# Patient Record
Sex: Male | Born: 1985 | Hispanic: No | Marital: Married | Smoking: Current every day smoker
Health system: Southern US, Community
[De-identification: ages and names within clinical notes are randomized; demographics above are authoritative.]

## PROBLEM LIST (undated history)

## (undated) HISTORY — PX: KNEE SURGERY: SHX244

---
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
## 2017-09-25 ENCOUNTER — Ambulatory Visit: Payer: Self-pay | Admitting: Family Medicine

## 2017-10-09 ENCOUNTER — Ambulatory Visit (INDEPENDENT_AMBULATORY_CARE_PROVIDER_SITE_OTHER): Payer: 59 | Admitting: Family Medicine

## 2017-10-09 ENCOUNTER — Encounter: Payer: Self-pay | Admitting: Family Medicine

## 2017-10-09 VITALS — BP 124/71 | HR 71 | Ht 74.0 in | Wt 269.0 lb

## 2017-10-09 DIAGNOSIS — K649 Unspecified hemorrhoids: Secondary | ICD-10-CM

## 2017-10-09 DIAGNOSIS — B356 Tinea cruris: Secondary | ICD-10-CM

## 2017-10-09 DIAGNOSIS — L84 Corns and callosities: Secondary | ICD-10-CM | POA: Diagnosis not present

## 2017-10-09 MED ORDER — TERBINAFINE HCL 1 % EX CREA: 1.0000 "application " | TOPICAL_CREAM | Freq: Two times a day (BID) | CUTANEOUS | 0 refills | Status: DC

## 2017-10-09 NOTE — Progress Notes (Signed)
Subjective:    Patient ID: Glen Weaver, male    DOB: Apr 09, 1985, 32 y.o.   MRN: [id number]  HPI 32 yo male is here today to estab care.    He has a raise on his inner thighs. Has been using "jock itch" cream.  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  Says the cream helps with the itch.  He ran out and has not used it for quite some time and now it has spread.  He did have an episode where he had a very swollen hemorrhoid and actually went to the emergency department.  He was given suppositories and a cream which she has been using.  He did not start the stool softener.  He says a couple months ago he actually changed his diet and is eating a keto type diet and switched completely to water but has had more hard stools since then.  He is also had a spot on the bottom of his left foot to the lateral side.  It is getting painful to walk on.  He is not sure what has caused it no trauma or injury.   Review of Systems  Constitutional: Negative for diaphoresis, fever and unexpected weight change.  HENT: Negative for hearing loss, rhinorrhea, sneezing and tinnitus.   Eyes: Negative for visual disturbance.  Respiratory: Negative for cough and wheezing.   Cardiovascular: Negative for chest pain and palpitations.  Gastrointestinal: Negative for blood in stool, diarrhea, nausea and vomiting.  Genitourinary: Negative for discharge and dysuria.  Musculoskeletal: Negative for arthralgias and myalgias.  Skin: Positive for rash.  Neurological: Negative for headaches.  Hematological: Negative for adenopathy.  Psychiatric/Behavioral: Negative for dysphoric mood and sleep disturbance. The patient is not nervous/anxious.     BP 124/71   Pulse 71   Ht 6\' 2"  (1.88 m)   Wt 269 lb (122 kg)   SpO2 99%   BMI 34.54 kg/m     Not on File  History reviewed. No pertinent past medical history.  Past Surgical History:  Procedure Laterality Date  . KNEE SURGERY Left    orthoscopic surgery    Social  History   Socioeconomic History  . Marital status: Married    Spouse name: Not on file  . Number of children: Not on file  . Years of education: Not on file  . Highest education level: Not on file  Occupational History  . Occupation: operates heavy Financial controller  . Financial resource strain: Not on file  . Food insecurity:    Worry: Not on file    Inability: Not on file  . Transportation needs:    Medical: Not on file    Non-medical: Not on file  Tobacco Use  . Smoking status: Current Every Day Smoker    Packs/day: 1.00    Types: Cigarettes    Start date: 2009  . Smokeless tobacco: Never Used  Substance and Sexual Activity  . Alcohol use: Yes    Alcohol/week: 12.0 standard drinks    Types: 12 Cans of beer per week  . Drug use: Yes    Types: Marijuana    Comment: rarely  . Sexual activity: Yes    Partners: Female  Lifestyle  . Physical activity:    Days per week: Not on file    Minutes per session: Not on file  . Stress: Not on file  Relationships  . Social connections:    Talks on phone: Not on file  Gets together: Not on file    Attends religious service: Not on file    Active member of club or organization: Not on file    Attends meetings of clubs or organizations: Not on file    Relationship status: Not on file  . Intimate partner violence:    Fear of current or ex partner: Not on file    Emotionally abused: Not on file    Physically abused: Not on file    Forced sexual activity: Not on file  Other Topics Concern  . Not on file  Social History Narrative  . Not on file    Family History  Problem Relation Age of Onset  . Hypertension Father     Outpatient Encounter Medications as of 10/09/2017  Medication Sig  . terbinafine (LAMISIL) 1 % cream Apply 1 application topically 2 (two) times daily. X 4 weeks.   No facility-administered encounter medications on file as of 10/09/2017.          Objective:   Physical Exam  Constitutional:  He is oriented to person, place, and time. He appears well-developed and well-nourished.  HENT:  Head: Normocephalic and atraumatic.  Cardiovascular: Normal rate, regular rhythm and normal heart sounds.  Pulmonary/Chest: Effort normal and breath sounds normal.  Neurological: He is alert and oriented to person, place, and time.  Skin: Skin is warm and dry.  At the bottom of the left foot laterally he has a corn.  Has a significant amount of callus formation on top of it.  On his right inner groin crease he has slightly pink well-demarcated rash.  No satellite lesions or increased moisture.  Psychiatric: He has a normal mood and affect. His behavior is normal.        Assessment & Plan:  Tinea cruris -based on exam and most consistent with tinea cruris.  Will treat with terbinafine 1% cream twice a day for 4 weeks.  If not completely resolved and please give us a call back.  Corn/Callous -recommend a trial of the over-the-counter corn and callus remover is a common medicated pads.  Also recommended using a pumice stone to remove some the dead skin from the service but just not using a pumice stone elsewhere on the body.  Hemorrhoids-did discuss the importance of getting on a stool softener.  This can help keep the stools soft without causing diarrhea which he is worried about health.  Explained that the straining to void the stool is likely what is causing the hemorrhoids to flare.  If he would like a refill of the suppositories he can call us back at any time.

## 2017-10-09 NOTE — Patient Instructions (Addendum)
Corns and Calluses Corns are small areas of thickened skin that occur on the top, sides, or tip of a toe. They contain a cone-shaped core with a point that can press on a nerve below. This causes pain. Calluses are areas of thickened skin that can occur anywhere on the body including hands, fingers, palms, soles of the feet, and heels.Calluses are usually larger than corns. What are the causes? Corns and calluses are caused by rubbing (friction) or pressure, such as from shoes that are too tight or do not fit properly. What increases the risk? Corns are more likely to develop in people who have toe deformities, such as hammer toes. Since calluses can occur with friction to any area of the skin, calluses are more likely to develop in people who:  Work with their hands.  Wear shoes that fit poorly, shoes that are too tight, or shoes that are high-heeled.  Have toes deformities.  What are the signs or symptoms? Symptoms of a corn or callus include:  A hard growth on the skin.  Pain or tenderness under the skin.  Redness and swelling.  Increased discomfort while wearing tight-fitting shoes.  How is this diagnosed? Corns and calluses may be diagnosed with a medical history and physical exam. How is this treated? Corns and calluses may be treated with:  Removing the cause of the friction or pressure. This may include: ? Changing your shoes. ? Wearing shoe inserts (orthotics) or other protective layers in your shoes, such as a corn pad. ? Wearing gloves.  Medicines to help soften skin in the hardened, thickened areas.  Reducing the size of the corn or callus by removing the dead layers of skin.  Antibiotic medicines to treat infection.  Surgery, if a toe deformity is the cause.  Follow these instructions at home:  Take medicines only as directed by your health care provider.  If you were prescribed an antibiotic, finish all of it even if you start to feel better.  Wear  shoes that fit well. Avoid wearing high-heeled shoes and shoes that are too tight or too loose.  Wear any padding, protective layers, gloves, or orthotics as directed by your health care provider.  Soak your hands or feet and then use a file or pumice stone to soften your corn or callus. Do this as directed by your health care provider.  Check your corn or callus every day for signs of infection. Watch for: ? Redness, swelling, or pain. ? Fluid, blood, or pus. Contact a health care provider if:  Your symptoms do not improve with treatment.  You have increased redness, swelling, or pain at the site of your corn or callus.  You have fluid, blood, or pus coming from your corn or callus.  You have new symptoms. This information is not intended to replace advice given to you by your health care provider. Make sure you discuss any questions you have with your health care provider. Document Released: 11/10/2003 Document Revised: 08/24/2015 Document Reviewed: 01/30/2014 Elsevier Interactive Patient Education  2018 Elsevier Inc. Jock Itch Jock itch (tinea cruris) is a fungal infection of the skin in the groin area. It is sometimes called ringworm, even though it is not caused by worms. It is caused by a fungus, which is a type of germ that thrives in dark, damp places. Jock itch causes a rash and itching in the groin and upper thigh area. It usually goes away in 2-3 weeks with treatment. What are the causes? The  fungus that causes jock itch may be spread by:  Touching a fungus infection elsewhere on your body-such as athlete's foot-and then touching your groin area.  Sharing towels or clothing with an infected person.  What increases the risk? Jock itch is most common in men and adolescent boys. This condition is more likely to develop from:  Being in hot, humid climates.  Wearing tight-fitting clothing or wet bathing suits for long periods of time.  Participating in sports.  Being  overweight.  Having diabetes.  What are the signs or symptoms? Symptoms of jock itch may include:  A red, pink, or brown rash in the groin area. The rash may spread to the thighs, anus, and buttocks.  Dry and scaly skin on or around the rash.  Itchiness.  How is this diagnosed? Most often, a health care provider can make the diagnosis by looking at your rash. Sometimes, a scraping of the infected skin will be taken. This sample may be tested by looking at it under a microscope or by trying to grow the fungus from the sample (culture). How is this treated? Treatment for this condition may include:  Antifungal medicine to kill the fungus. This may be in various forms: ? Skin cream or ointment. ? Medicine taken by mouth.  Skin cream or ointment to reduce the itching.  Compresses or medicated powders to dry the infected skin.  Follow these instructions at home:  Take medicines only as directed by your health care provider. Apply skin creams or ointments exactly as directed.  Wear loose-fitting clothing. ? Men should wear cotton boxer shorts. ? Women should wear cotton underwear.  Change your underwear every day to keep your groin dry.  Avoid hot baths.  Dry your groin area well after bathing. ? Use a separate towel to dry your groin area. This will help to prevent a spreading of the infection to other areas of your body.  Do not scratch the affected area.  Do not share towels with other people. Contact a health care provider if:  Your rash does not improve or it gets worse after 2 weeks of treatment.  Your rash is spreading.  Your rash returns after treatment is finished.  You have a fever.  You have redness, swelling, or pain in the area around your rash.  You have fluid, blood, or pus coming from your rash.  Your have your rash for more than 4 weeks. This information is not intended to replace advice given to you by your health care provider. Make sure you  discuss any questions you have with your health care provider. Document Released: 01/24/2002 Document Revised: 07/12/2015 Document Reviewed: 11/15/2013 Elsevier Interactive Patient Education  [employer]2018 Elsevier Inc[employer]

## 2018-02-12 ENCOUNTER — Ambulatory Visit (INDEPENDENT_AMBULATORY_CARE_PROVIDER_SITE_OTHER): Payer: 59

## 2018-02-12 ENCOUNTER — Encounter: Payer: Self-pay | Admitting: Sports Medicine

## 2018-02-12 ENCOUNTER — Ambulatory Visit (INDEPENDENT_AMBULATORY_CARE_PROVIDER_SITE_OTHER): Payer: 59 | Admitting: Sports Medicine

## 2018-02-12 VITALS — BP 120/70 | HR 79 | Temp 98.0°F | Ht 74.0 in | Wt 259.0 lb

## 2018-02-12 DIAGNOSIS — J029 Acute pharyngitis, unspecified: Secondary | ICD-10-CM | POA: Diagnosis not present

## 2018-02-12 DIAGNOSIS — R918 Other nonspecific abnormal finding of lung field: Secondary | ICD-10-CM | POA: Diagnosis not present

## 2018-02-12 DIAGNOSIS — J181 Lobar pneumonia, unspecified organism: Secondary | ICD-10-CM

## 2018-02-12 DIAGNOSIS — R059 Cough, unspecified: Secondary | ICD-10-CM

## 2018-02-12 DIAGNOSIS — R05 Cough: Secondary | ICD-10-CM

## 2018-02-12 DIAGNOSIS — J189 Pneumonia, unspecified organism: Secondary | ICD-10-CM | POA: Insufficient documentation

## 2018-02-12 LAB — POCT INFLUENZA A/B
Influenza A, POC: NEGATIVE
Influenza B, POC: NEGATIVE

## 2018-02-12 MED ORDER — AZITHROMYCIN 250 MG PO TABS: ORAL_TABLET | ORAL | 0 refills | Status: DC

## 2018-02-12 NOTE — Addendum Note (Signed)
Addended by: Monica BectonHEKKEKANDAM, THOMAS J on: 02/12/2018 09:33 AM   Modules accepted: Orders

## 2018-02-12 NOTE — Assessment & Plan Note (Addendum)
Malaise, myalgias, cough with mild hemoptysis. Left upper lobe coarse crackles. Adding a chest x-ray, azithromycin for suspected pneumonia. Flu test negative. Return if no better in a week or 2. May use over-the-counter cold and flu medication for symptomatic relief.  Glen Weaver does have a pneumonia, though it is in the right upper lobe, he needs to complete the course of azithromycin and in a month we probably need a new chest x-ray to ensure clearance considering his hemoptysis.

## 2018-02-12 NOTE — Progress Notes (Addendum)
Subjective:    CC: Feeling sick  HPI: This is a pleasant and previously healthy 32 year old male, for the past few days he has had increasing malaise, myalgias, no fevers or chills.  Mild cough productive of bloody sputum.  No GI symptoms, skin rash.  Symptoms are moderate, persistent.  I reviewed the past medical history, family history, social history, surgical history, and allergies today and no changes were needed.  Please see the problem list section below in epic for further details.  Past Medical History: No past medical history on file. Past Surgical History: Past Surgical History:  Procedure Laterality Date  . KNEE SURGERY Left    orthoscopic surgery   Social History: Social History   Socioeconomic History  . Marital status: Married    Spouse name: Not on file  . Number of children: Not on file  . Years of education: Not on file  . Highest education level: Not on file  Occupational History  . Occupation: operates heavy Financial controllermachineray  Social Needs  . Financial resource strain: Not on file  . Food insecurity:    Worry: Not on file    Inability: Not on file  . Transportation needs:    Medical: Not on file    Non-medical: Not on file  Tobacco Use  . Smoking status: Current Every Day Smoker    Packs/day: 1.00    Types: Cigarettes    Start date: 2009  . Smokeless tobacco: Never Used  Substance and Sexual Activity  . Alcohol use: Yes    Alcohol/week: 12.0 standard drinks    Types: 12 Cans of beer per week  . Drug use: Yes    Types: Marijuana    Comment: rarely  . Sexual activity: Yes    Partners: Female  Lifestyle  . Physical activity:    Days per week: Not on file    Minutes per session: Not on file  . Stress: Not on file  Relationships  . Social connections:    Talks on phone: Not on file    Gets together: Not on file    Attends religious service: Not on file    Active member of club or organization: Not on file    Attends meetings of clubs or  organizations: Not on file    Relationship status: Not on file  Other Topics Concern  . Not on file  Social History Narrative  . Not on file   Family History: Family History  Problem Relation Age of Onset  . Hypertension Father    Allergies: No Known Allergies Medications: See med rec.  Review of Systems: No fevers, chills, night sweats, weight loss, chest pain, or shortness of breath.   Objective:    General: Well Developed, well nourished, and in no acute distress.  Neuro: Alert and oriented x3, extra-ocular muscles intact, sensation grossly intact.  HEENT: Normocephalic, atraumatic, pupils equal round reactive to light, neck supple, no masses, no lymphadenopathy, thyroid nonpalpable.  Oropharynx, nasopharynx, ear canals unremarkable. Skin: Warm and dry, no rashes. Cardiac: Regular rate and rhythm, no murmurs rubs or gallops, no lower extremity edema.  Respiratory: Coarse crackles in the left upper lobe. Not using accessory muscles, speaking in full sentences.  Impression and Recommendations:    Community acquired pneumonia Malaise, myalgias, cough with mild hemoptysis. Left upper lobe coarse crackles. Adding a chest x-ray, azithromycin for suspected pneumonia. Flu test negative. Return if no better in a week or 2. May use over-the-counter cold and flu medication for symptomatic relief.  Ivin BootyJoshua does have a pneumonia, though it is in the right upper lobe, he needs to complete the course of azithromycin and in a month we probably need a new chest x-ray to ensure clearance considering his hemoptysis. ___________________________________________ Ihor Austinhomas J. Benjamin Stainhekkekandam, M.D., ABFM., CAQSM. Primary Care and Sports Medicine [facility]  Adjunct Professor of Family Medicine  [employer]

## 2018-03-04 ENCOUNTER — Ambulatory Visit (INDEPENDENT_AMBULATORY_CARE_PROVIDER_SITE_OTHER): Payer: 59

## 2018-03-04 ENCOUNTER — Ambulatory Visit (INDEPENDENT_AMBULATORY_CARE_PROVIDER_SITE_OTHER): Payer: 59 | Admitting: Family Medicine

## 2018-03-04 ENCOUNTER — Encounter: Payer: Self-pay | Admitting: Family Medicine

## 2018-03-04 VITALS — BP 118/75 | HR 75 | Temp 98.4°F | Ht 74.0 in | Wt 262.0 lb

## 2018-03-04 DIAGNOSIS — R9389 Abnormal findings on diagnostic imaging of other specified body structures: Secondary | ICD-10-CM | POA: Diagnosis not present

## 2018-03-04 DIAGNOSIS — R05 Cough: Secondary | ICD-10-CM

## 2018-03-04 DIAGNOSIS — R059 Cough, unspecified: Secondary | ICD-10-CM

## 2018-03-04 DIAGNOSIS — Z720 Tobacco use: Secondary | ICD-10-CM | POA: Diagnosis not present

## 2018-03-04 DIAGNOSIS — R0602 Shortness of breath: Secondary | ICD-10-CM | POA: Diagnosis not present

## 2018-03-04 MED ORDER — VARENICLINE TARTRATE 0.5 MG X 11 & 1 MG X 42 PO MISC: ORAL | 0 refills | Status: AC

## 2018-03-04 NOTE — Progress Notes (Signed)
Acute Office Visit  Subjective:    Patient ID: Glen Weaver, male    DOB: 03/27/1985, 33 y.o.   MRN: [id number]  Chief Complaint  Patient presents with  . Cough  . Shortness of Breath  . Chest Pain    HPI Patient is in today for cough.  A 33 year old male who currently smokes between a half a pack to three quarters of a pack of cigarettes per day.  He has no prior history of pulmonary disease or asthma.  Wife who is here with him today says that he always has a little bit of his chronic smoker's cough is always worse in the morning and in the evenings.  But more recently he came into the office on December 27 and saw 1 of my partners for sore throat and increased cough.  He did not have any fevers or chills and would report that sometimes he would see just a little bit of blood-tinged sputum usually just a little spot or small streak and it was usually bright red.  He was given a prescription for azithromycin and says that he did feel better.  But today he feels like his cough has ramped back up and has had a little bit of nasal congestion.  He is also had some more sinus drainage this week.  He feels like he has been hydrating well.  He has not noticed any wheezing.  He denies any prior history of childhood asthma etc.  Today he also felt a little bit of a pressure sensation in his chest.  He says it just him is feels like congestion in the mid chest area.  He said it was a little bit more uncomfortable this morning he says he would not really describe it as painful but just feels like there is a little bit there again as the day has gone on.  He feels just a little short of breath but no actual wheezing or increased respiratory rate.  Study Result   CLINICAL DATA:  33 year old male with cough and congestion for 3 days.  EXAM: CHEST - 2 VIEW  COMPARISON:  None.  FINDINGS: Opacity/consolidation within the MEDIAL RIGHT UPPER lobe is noted, most likely representing  pneumonia.  The lungs are otherwise clear.  No pleural effusion or pneumothorax.  Cardiomediastinal silhouette is otherwise unremarkable.  No acute bony abnormalities are identified.  IMPRESSION: MEDIAL RIGHT UPPER lobe opacity/consolidation, likely representing pneumonia. Given radiographic appearance, short-term chest x-ray follow-up is recommended to ensure improvement/resolution following appropriate therapy.      No past medical history on file.  Past Surgical History:  Procedure Laterality Date  . KNEE SURGERY Left    orthoscopic surgery    Family History  Problem Relation Age of Onset  . Hypertension Father     Social History   Socioeconomic History  . Marital status: Married    Spouse name: Not on file  . Number of children: Not on file  . Years of education: Not on file  . Highest education level: Not on file  Occupational History  . Occupation: operates heavy Psychologist, counselling  . Financial resource strain: Not on file  . Food insecurity:    Worry: Not on file    Inability: Not on file  . Transportation needs:    Medical: Not on file    Non-medical: Not on file  Tobacco Use  . Smoking status: Current Every Day Smoker    Packs/day: 1.00    Types:  Cigarettes    Start date: 2009  . Smokeless tobacco: Never Used  Substance and Sexual Activity  . Alcohol use: Yes    Alcohol/week: 12.0 standard drinks    Types: 12 Cans of beer per week  . Drug use: Yes    Types: Marijuana    Comment: rarely  . Sexual activity: Yes    Partners: Female  Lifestyle  . Physical activity:    Days per week: Not on file    Minutes per session: Not on file  . Stress: Not on file  Relationships  . Social connections:    Talks on phone: Not on file    Gets together: Not on file    Attends religious service: Not on file    Active member of club or organization: Not on file    Attends meetings of clubs or organizations: Not on file    Relationship status:  Not on file  . Intimate partner violence:    Fear of current or ex partner: Not on file    Emotionally abused: Not on file    Physically abused: Not on file    Forced sexual activity: Not on file  Other Topics Concern  . Not on file  Social History Narrative  . Not on file    Outpatient Medications Prior to Visit  Medication Sig Dispense Refill  . azithromycin (ZITHROMAX Z-PAK) 250 MG tablet Take 2 tablets (500 mg) on  Day 1,  followed by 1 tablet (250 mg) once daily on Days 2 through 5. (Patient not taking: Reported on 03/04/2018) 6 tablet 0   No facility-administered medications prior to visit.     No Known Allergies  ROS     Objective:    Physical Exam  Constitutional: He is oriented to person, place, and time. He appears well-developed and well-nourished.  HENT:  Head: Normocephalic and atraumatic.  Right Ear: External ear normal.  Left Ear: External ear normal.  Nose: Nose normal.  Mouth/Throat: Oropharynx is clear and moist.  TMs and canals are clear.   Eyes: Pupils are equal, round, and reactive to light. Conjunctivae and EOM are normal.  Neck: Neck supple. No thyromegaly present.  Cardiovascular: Normal rate and normal heart sounds.  Pulmonary/Chest: Effort normal and breath sounds normal.  Lymphadenopathy:    He has no cervical adenopathy.  Neurological: He is alert and oriented to person, place, and time.  Skin: Skin is warm and dry.  Psychiatric: He has a normal mood and affect.    BP 118/75   Pulse 75   Temp 98.4 F (36.9 C) (Oral)   Ht _0  (1.88 m)   Wt 262 lb (118.8 kg)   SpO2 98%   BMI 33.64 kg/m  Wt Readings from Last 3 Encounters:  03/04/18 262 lb (118.8 kg)  02/12/18 259 lb (117.5 kg)  10/09/17 269 lb (122 kg)    There are no preventive care reminders to display for this patient.  There are no preventive care reminders to display for this patient.   No results found for: TSH No results found for: WBC, HGB, HCT, MCV, PLT No results  found for: NA, K, CHLORIDE, CO2, GLUCOSE, BUN, CREATININE, BILITOT, ALKPHOS, AST, ALT, PROT, ALBUMIN, CALCIUM, ANIONGAP, EGFR, GFR No results found for: CHOL No results found for: HDL No results found for: LDLCALC No results found for: TRIG No results found for: CHOLHDL No results found for: HGBA1C     Assessment & Plan:   Problem List Items Addressed  This Visit      Other   Tobacco abuse    Other Visit Diagnoses    Cough    -  Primary   Relevant Orders   DG Chest 2 View   Abnormal chest x-ray       Relevant Orders   DG Chest 2 View     With cough and abnormal chest xray with increase in symptoms today will go and and repeat chest x-ray early and so instead of waiting till the end of the month.  He has seen just a little tinge of blood which could be coming more from the sinuses or even the back of the throat but if the chest x-ray has not improved then consider further work-up including testing for TB and getting some labs including a CBC with differential and maybe even a chest CT.  Tobacco abuse-discussed cessation discuss strategies around this including Chantix and Wellbutrin as medication options.  He would like to try Chantix discussed risk benefits and potential side effects.  Meds ordered this encounter  Medications  . varenicline (CHANTIX PAK) 0.5 MG X 11 & 1 MG X 42 tablet    Sig: Take one 0.5 mg tablet by mouth once daily for 3 days, then increase to one 0.5 mg tablet twice daily for 4 days, then increase to one 1 mg tablet twice daily.    Dispense:  53 tablet    Refill:  0     Beatrice Lecher, MD

## 2018-03-05 ENCOUNTER — Ambulatory Visit: Payer: 59 | Admitting: Family Medicine

## 2018-03-08 ENCOUNTER — Ambulatory Visit (INDEPENDENT_AMBULATORY_CARE_PROVIDER_SITE_OTHER): Payer: 59

## 2018-03-08 DIAGNOSIS — R0602 Shortness of breath: Secondary | ICD-10-CM

## 2018-03-08 DIAGNOSIS — R9389 Abnormal findings on diagnostic imaging of other specified body structures: Secondary | ICD-10-CM

## 2018-03-08 DIAGNOSIS — R918 Other nonspecific abnormal finding of lung field: Secondary | ICD-10-CM

## 2018-03-08 DIAGNOSIS — R05 Cough: Secondary | ICD-10-CM

## 2018-03-08 DIAGNOSIS — R222 Localized swelling, mass and lump, trunk: Secondary | ICD-10-CM

## 2018-03-08 DIAGNOSIS — R059 Cough, unspecified: Secondary | ICD-10-CM

## 2018-03-11 ENCOUNTER — Encounter: Payer: Self-pay | Admitting: Pulmonary Disease

## 2018-03-11 ENCOUNTER — Ambulatory Visit (INDEPENDENT_AMBULATORY_CARE_PROVIDER_SITE_OTHER): Payer: 59 | Admitting: Pulmonary Disease

## 2018-03-11 VITALS — BP 132/62 | HR 97 | Ht 72.0 in | Wt 259.6 lb

## 2018-03-11 DIAGNOSIS — J181 Lobar pneumonia, unspecified organism: Secondary | ICD-10-CM | POA: Diagnosis not present

## 2018-03-11 DIAGNOSIS — J189 Pneumonia, unspecified organism: Secondary | ICD-10-CM

## 2018-03-11 DIAGNOSIS — R911 Solitary pulmonary nodule: Secondary | ICD-10-CM

## 2018-03-11 MED ORDER — LEVOFLOXACIN 750 MG PO TABS: 750.0000 mg | ORAL_TABLET | Freq: Every day | ORAL | 0 refills | Status: AC

## 2018-03-11 NOTE — Progress Notes (Signed)
Glen Weaver    [id number]    1985-06-18  Primary Care Physician:Metheney, Barbarann Ehlers, MD  Referring Physician: Agapito Games, MD [address] [place], [place] [address]  Chief complaint: Abnormal CT scan  HPI: 33 year old with no significant past medical history.   He was evaluated for community-acquired pneumonia with right upper lobe medial infiltrate at the end of December.  Treated with azithromycin.  Symptoms at that time consist of malaise, myalgia, cough with mild hemoptysis.  Seen again primary care on 1/16 with ongoing cough Repeat chest x-ray showed persistence of infiltrate and follow-up CT showed right upper lobe consolidation.  He has been referred here for further evaluation Overall he feels he is better than before.  Still has some daily cough in the morning with white mucus.  No fevers, chills.  States that hemoptysis has resolved  No risk factors for TB.  No history of incarceration, homelessness, IV drug use.  He is married with his wife and child.  No high risk sexual activity.  No known exposure to TB  Pets: No pets Occupation: Works as a Fish farm manager for the [employer] Exposures: No known exposures Smoking history: He is an active smoker 15-pack-year history, no e-cigarette use,  Travel history: Significant travel history Relevant family history: No significant family history of lung disease  Outpatient Encounter Medications as of 03/11/2018  Medication Sig  . varenicline (CHANTIX PAK) 0.5 MG X 11 & 1 MG X 42 tablet Take one 0.5 mg tablet by mouth once daily for 3 days, then increase to one 0.5 mg tablet twice daily for 4 days, then increase to one 1 mg tablet twice daily.   No facility-administered encounter medications on file as of 03/11/2018.     Allergies as of 03/11/2018  . (No Known Allergies)    No past medical history on file.  Past Surgical History:  Procedure Laterality Date  . KNEE SURGERY Left    orthoscopic surgery    Family History  Problem Relation Age of Onset  . Hypertension Father   . Cancer Mother     Social History   Socioeconomic History  . Marital status: Married    Spouse name: Not on file  . Number of children: Not on file  . Years of education: Not on file  . Highest education level: Not on file  Occupational History  . Occupation: operates heavy Financial controller  . Financial resource strain: Not on file  . Food insecurity:    Worry: Not on file    Inability: Not on file  . Transportation needs:    Medical: Not on file    Non-medical: Not on file  Tobacco Use  . Smoking status: Current Every Day Smoker    Packs/day: 0.25    Types: Cigarettes    Start date: 2009  . Smokeless tobacco: Never Used  Substance and Sexual Activity  . Alcohol use: Yes    Alcohol/week: 12.0 standard drinks    Types: 12 Cans of beer per week    Comment: occ  . Drug use: Yes    Types: Marijuana    Comment: rarely  . Sexual activity: Yes    Partners: Female  Lifestyle  . Physical activity:    Days per week: Not on file    Minutes per session: Not on file  . Stress: Not on file  Relationships  . Social connections:    Talks  on phone: Not on file    Gets together: Not on file    Attends religious service: Not on file    Active member of club or organization: Not on file    Attends meetings of clubs or organizations: Not on file    Relationship status: Not on file  . Intimate partner violence:    Fear of current or ex partner: Not on file    Emotionally abused: Not on file    Physically abused: Not on file    Forced sexual activity: Not on file  Other Topics Concern  . Not on file  Social History Narrative  . Not on file    Review of systems: Review of Systems  Constitutional: Negative for fever and chills.  HENT: Negative.   Eyes: Negative for blurred vision.  Respiratory: as per HPI  Cardiovascular: Negative for chest pain and palpitations.    Gastrointestinal: Negative for vomiting, diarrhea, blood per rectum. Genitourinary: Negative for dysuria, urgency, frequency and hematuria.  Musculoskeletal: Negative for myalgias, back pain and joint pain.  Skin: Negative for itching and rash.  Neurological: Negative for dizziness, tremors, focal weakness, seizures and loss of consciousness.  Endo/Heme/Allergies: Negative for environmental allergies.  Psychiatric/Behavioral: Negative for depression, suicidal ideas and hallucinations.  All other systems reviewed and are negative.  Physical Exam: Blood pressure 132/62, pulse 97, height 6' (1.829 m), weight 259 lb 9.6 oz (117.8 kg), SpO2 97 %. Gen:      No acute distress HEENT:  EOMI, sclera anicteric Neck:     No masses; no thyromegaly Lungs:    Clear to auscultation bilaterally; normal respiratory effort CV:         Regular rate and rhythm; no murmurs Abd:      + bowel sounds; soft, non-tender; no palpable masses, no distension Ext:    No edema; adequate peripheral perfusion Skin:      Warm and dry; no rash Neuro: alert and oriented x 3 Psych: normal mood and affect  Data Reviewed: Imaging: Chest x-ray 02/12/2018- medial right upper lobe opacity Chest x-ray 03/04/2018- persistent consolidation in the right upper lobe with slight partial clearing. CT chest 03/08/2018- 5 cm area of consolidation in the right upper lobe possible mediastinal invasion.  I have reviewed the images personally.  Assessment:  Abnormal CT with right upper lobe consolidation Suspect this is secondary to community-acquired pneumonia.  Symptomatically he is better although he has slight persistent cough.  Follow-up chest x-ray does show some partial clearing.  CT scan reports possible mediastinal invasion but this is not entirely clear on my review of imaging. We discussed going ahead with bronchoscopy.  The area of consolidation is at a place that is difficult to get out and he would need navigational  bronchoscopy under general anesthesia to access it. Other option would be to give him another course of antibiotic and repeat CT scan  We have decided to go with conservative management.  I will give him a course of Levaquin and follow-up CT chest with contrast in 1 month If the consolidation is persistent then we will plan for bronchoscopy.  Plan/Recommendations: - Levofloxacin 750 mg a day for 7 days - CT chest with contrast in 1 month.  Chilton Greathouse MD [facility] 03/11/2018, 3:40 PM  CC: Agapito Games, *

## 2018-03-11 NOTE — Patient Instructions (Signed)
Will prescribe levofloxacin for 7 days CT chest with contrast in 1 month Follow-up in clinic after CT chest.

## 2018-03-11 NOTE — Addendum Note (Signed)
Addended by: Maxwell MarionBLANKENSHIP, MARGIE A on: 03/11/2018 04:17 PM   Modules accepted: Orders

## 2018-04-01 ENCOUNTER — Ambulatory Visit (INDEPENDENT_AMBULATORY_CARE_PROVIDER_SITE_OTHER): Payer: 59 | Admitting: Family Medicine

## 2018-04-01 ENCOUNTER — Encounter: Payer: Self-pay | Admitting: Family Medicine

## 2018-04-01 VITALS — BP 132/81 | HR 95 | Temp 98.9°F | Ht 74.0 in | Wt 271.0 lb

## 2018-04-01 DIAGNOSIS — R6889 Other general symptoms and signs: Secondary | ICD-10-CM | POA: Diagnosis not present

## 2018-04-01 LAB — POCT INFLUENZA A/B
INFLUENZA B, POC: NEGATIVE
Influenza A, POC: NEGATIVE

## 2018-04-01 MED ORDER — OSELTAMIVIR PHOSPHATE 75 MG PO CAPS: 75.0000 mg | ORAL_CAPSULE | Freq: Two times a day (BID) | ORAL | 0 refills | Status: AC

## 2018-04-01 NOTE — Patient Instructions (Addendum)
Thank you for coming in today. Take tylenol and ibuprofen or aleve.   Max dose of tylenol is 1000mg  every 6 hours. Be careful with combo medicines that also have tylenol acetaminophen Ok to take with ibuprofen or aleve.  Max dose of ibuprofen is 800mg  every 8 hours.  Max dose of aleve is 2 pills every 12 hours.    Ok to take generic muccinex DM extended release twice daily for cough.   Ok to take generic immodium as needed for diarrhea.    Call or go to the emergency room if you get worse, have trouble breathing, have chest pains, or palpitations.     Viral Respiratory Infection A respiratory infection is an illness that affects part of the respiratory system, such as the lungs, nose, or throat. A respiratory infection that is caused by a virus is called a viral respiratory infection. Common types of viral respiratory infections include:  A cold.  The flu (influenza).  A respiratory syncytial virus (RSV) infection. What are the causes? This condition is caused by a virus. What are the signs or symptoms? Symptoms of this condition include:  A stuffy or runny nose.  Yellow or green nasal discharge.  A cough.  Sneezing.  Fatigue.  Achy muscles.  A sore throat.  Sweating or chills.  A fever.  A headache. How is this diagnosed? This condition may be diagnosed based on:  Your symptoms.  A physical exam.  Testing of nasal swabs. How is this treated? This condition may be treated with medicines, such as:  Antiviral medicine. This may shorten the length of time a person has symptoms.  Expectorants. These make it easier to cough up mucus.  Decongestant nasal sprays.  Acetaminophen or NSAIDs to relieve fever and pain. Antibiotic medicines are not prescribed for viral infections. This is because antibiotics are designed to kill bacteria. They are not effective against viruses. Follow these instructions at home:  Managing pain and congestion  Take  over-the-counter and prescription medicines only as told by your health care provider.  If you have a sore throat, gargle with a salt-water mixture 3-4 times a day or as needed. To make a salt-water mixture, completely dissolve -1 tsp of salt in 1 cup of warm water.  Use nose drops made from salt water to ease congestion and soften raw skin around your nose.  Drink enough fluid to keep your urine pale yellow. This helps prevent dehydration and helps loosen up mucus. General instructions  Rest as much as possible.  Do not drink alcohol.  Do not use any products that contain nicotine or tobacco, such as cigarettes and e-cigarettes. If you need help quitting, ask your health care provider.  Keep all follow-up visits as told by your health care provider. This is important. How is this prevented?   Get an annual flu shot. You may get the flu shot in late summer, fall, or winter. Ask your health care provider when you should get your flu shot.  Avoid exposing others to your respiratory infection. ? Stay home from work or school as told by your health care provider. ? Wash your hands with soap and water often, especially after you cough or sneeze. If soap and water are not available, use alcohol-based hand sanitizer.  Avoid contact with people who are sick during cold and flu season. This is generally fall and winter. Contact a health care provider if:  Your symptoms last for 10 days or longer.  Your symptoms get worse  over time.  You have a fever.  You have severe sinus pain in your face or forehead.  The glands in your jaw or neck become very swollen. Get help right away if you:  Feel pain or pressure in your chest.  Have shortness of breath.  Faint or feel like you will faint.  Have severe and persistent vomiting.  Feel confused or disoriented. Summary  A respiratory infection is an illness that affects part of the respiratory system, such as the lungs, nose, or throat.  A respiratory infection that is caused by a virus is called a viral respiratory infection.  Common types of viral respiratory infections are a cold, influenza, and respiratory syncytial virus (RSV) infection.  Symptoms of this condition include a stuffy or runny nose, cough, sneezing, fatigue, achy muscles, sore throat, and fevers or chills.  Antibiotic medicines are not prescribed for viral infections. This is because antibiotics are designed to kill bacteria. They are not effective against viruses. This information is not intended to replace advice given to you by your health care provider. Make sure you discuss any questions you have with your health care provider. Document Released: 11/13/2004 Document Revised: 03/16/2017 Document Reviewed: 03/16/2017 Elsevier Interactive Patient Education  2019 [employer].

## 2018-04-01 NOTE — Progress Notes (Signed)
Glen Weaver is a 33 y.o. male who presents to [facility]: Primary Care Sports Medicine today for body aches. Buster was diagnosed with pneumonia on 01/23. He finished up his antibiotic one week ago. He had been feeling completely better and his cough had resolved up until today.  Anikin woke up this morning with back pain and body aches, as well as subjective fevers and chills. He has had a mild cough, some nausea, and mild stomach upset. This feels exactly like when he has had flu in the past. His wife is pregnant and he has a 77 year old son, so he wants to be extra cautious. Took an alka-seltzer this morning that did not alleviate symptoms. Denies sore throat, congestion, runny nose, and vomiting. He was around a coworker who had a stomach bug last week, but no sick contacts with the flu.   ROS as above:  Exam:  BP 132/81   Pulse 95   Temp 98.9 F (37.2 C) (Oral)   Ht 6\' 2"  (1.88 m)   Wt 271 lb (122.9 kg)   SpO2 99%   BMI 34.79 kg/m  Wt Readings from Last 5 Encounters:  04/01/18 271 lb (122.9 kg)  03/11/18 259 lb 9.6 oz (117.8 kg)  03/04/18 262 lb (118.8 kg)  02/12/18 259 lb (117.5 kg)  10/09/17 269 lb (122 kg)    Gen: Well NAD nontoxic appearing HEENT: EOMI,  MMM, mildly erythematous oropharynx. Could not visualize TM due to cerumen.  Lungs: Normal work of breathing. CTABL Heart: RRR no MRG Abd: NABS, Soft. Nondistended, Nontender Exts: Brisk capillary refill, warm and well perfused.   Lab and Radiology Results Results for orders placed or performed in visit on 04/01/18 (from the past 72 hour(s))  POCT Influenza A/B     Status: None   Collection Time: 04/01/18  2:57 PM  Result Value Ref Range   Influenza A, POC Negative Negative   Influenza B, POC Negative Negative   No results found.    Assessment and Plan: 33 y.o. male with  Flu-like symptoms: Callis woke up with flu  like symptoms this morning, including body ache and subjective fevers and chills. This is how he felt the last time he had the flu. Flu test negative; however, he most likely has the flu, due to symptoms and presentation similar to last time he had the flu. Prescribed Tamiflu. Take Tylenol or ibuprofen as needed. Discussed max dose of Tylenol. Recommended Mucinex DM XR for cough. His family does not need prophylactic treatment with Tamiflu since his test was negative. Recommended Immodium if his stomach upset worsens.     Orders Placed This Encounter  Procedures  . POCT Influenza A/B   Meds ordered this encounter  Medications  . oseltamivir (TAMIFLU) 75 MG capsule    Sig: Take 1 capsule (75 mg total) by mouth 2 (two) times daily.    Dispense:  10 capsule    Refill:  0     Historical information moved to improve visibility of documentation.  No past medical history on file. Past Surgical History:  Procedure Laterality Date  . KNEE SURGERY Left    orthoscopic surgery   Social History   Tobacco Use  . Smoking status: Current Every Day Smoker    Packs/day: 0.25    Types: Cigarettes    Start date: 2009  . Smokeless tobacco: Never Used  Substance Use Topics  . Alcohol use: Yes  Alcohol/week: 12.0 standard drinks    Types: 12 Cans of beer per week    Comment: occ   family history includes Cancer in his mother; Hypertension in his father.  Medications: Current Outpatient Medications  Medication Sig Dispense Refill  . levofloxacin (LEVAQUIN) 750 MG tablet Take 1 tablet (750 mg total) by mouth daily. 7 tablet 0  . varenicline (CHANTIX PAK) 0.5 MG X 11 & 1 MG X 42 tablet Take one 0.5 mg tablet by mouth once daily for 3 days, then increase to one 0.5 mg tablet twice daily for 4 days, then increase to one 1 mg tablet twice daily. 53 tablet 0  . oseltamivir (TAMIFLU) 75 MG capsule Take 1 capsule (75 mg total) by mouth 2 (two) times daily. 10 capsule 0   No current  facility-administered medications for this visit.    No Known Allergies   Discussed warning signs or symptoms. Please see discharge instructions. Patient expresses understanding.  I personally was present and performed or re-performed the history, physical exam and medical decision-making activities of this service and have verified that the service and findings are accurately documented in the student's note. ___________________________________________ Clementeen Graham M.D., ABFM., CAQSM. Primary Care and Sports Medicine Adjunct Instructor of Family Medicine  [employer]

## 2018-04-08 ENCOUNTER — Other Ambulatory Visit: Payer: Self-pay | Admitting: Pulmonary Disease

## 2018-04-08 DIAGNOSIS — R911 Solitary pulmonary nodule: Secondary | ICD-10-CM

## 2018-04-13 ENCOUNTER — Other Ambulatory Visit: Payer: 59

## 2018-04-15 ENCOUNTER — Ambulatory Visit (INDEPENDENT_AMBULATORY_CARE_PROVIDER_SITE_OTHER): Payer: 59

## 2018-04-15 ENCOUNTER — Ambulatory Visit: Payer: 59 | Admitting: Pulmonary Disease

## 2018-04-15 DIAGNOSIS — R911 Solitary pulmonary nodule: Secondary | ICD-10-CM

## 2018-04-15 DIAGNOSIS — R918 Other nonspecific abnormal finding of lung field: Secondary | ICD-10-CM

## 2018-04-15 MED ORDER — IOPAMIDOL (ISOVUE-300) INJECTION 61%
100.0000 mL | Freq: Once | INTRAVENOUS | Status: AC | PRN
  Administered 2018-04-15: 80 mL via INTRAVENOUS

## 2018-04-16 ENCOUNTER — Ambulatory Visit: Payer: 59 | Admitting: Pulmonary Disease

## 2018-04-16 ENCOUNTER — Other Ambulatory Visit: Payer: 59

## 2018-04-19 ENCOUNTER — Telehealth: Payer: Self-pay | Admitting: Pulmonary Disease

## 2018-04-19 DIAGNOSIS — R911 Solitary pulmonary nodule: Secondary | ICD-10-CM

## 2018-04-19 NOTE — Telephone Encounter (Signed)
Pt is aware of below message and voiced his understanding.  Pt aware that our office will contact him to reschedule after receiving response from Dr. Isaiah Serge.

## 2018-04-19 NOTE — Telephone Encounter (Signed)
Received email from Dr. Romie Levee requesting that bronch from 04/21/18 be canceled, as he is sick. Spoke to [place] with [facility] and canceled bronch.  ATC pt- unable to leave voicemail, as mailbox is full. Will call back. Dr. Isaiah Serge please advise when you would like to reschedule bronch. Thanks

## 2018-04-19 NOTE — Telephone Encounter (Signed)
Pt called back. I advised him that the phone call was due to the rescheduling of his Bronch and that someone will give him a call back with more details.

## 2018-04-21 ENCOUNTER — Ambulatory Visit (HOSPITAL_COMMUNITY): Admission: RE | Admit: 2018-04-21 | Payer: 59 | Source: Home / Self Care | Admitting: Pulmonary Disease

## 2018-04-21 ENCOUNTER — Encounter (HOSPITAL_COMMUNITY): Admission: RE | Payer: Self-pay | Source: Home / Self Care

## 2018-04-21 ENCOUNTER — Ambulatory Visit (HOSPITAL_COMMUNITY): Payer: 59

## 2018-04-21 SURGERY — BRONCHOSCOPY, WITH FLUOROSCOPY
Anesthesia: Moderate Sedation | Laterality: Bilateral

## 2018-04-27 NOTE — Telephone Encounter (Signed)
Called and spoke with patient, he stated that he did not know that his procedure was scheduled for this date. Patient stated that he cannot do this day and it would be better if he could do a Friday or a Monday.  Dr. Isaiah Serge please advise thank you.

## 2018-04-27 NOTE — Telephone Encounter (Signed)
Rescheduled for 3/18 at [facility] 7:30 AM.

## 2018-04-29 NOTE — Telephone Encounter (Signed)
Please check if 3/20 at [facility] 7:30 am is ok with him

## 2018-04-29 NOTE — Telephone Encounter (Signed)
Attempted to call patient today regarding rescheduling bronch for 05/07/18 at 730am. I did not receive an answer at time of call, VM is full couldn't LVM. X1

## 2018-04-30 NOTE — Telephone Encounter (Signed)
Attempted to call patient today regarding rescheduling bronch for 05/07/18 at 730am. I did not receive an answer at time of call, VM is full couldn't LVM. X2

## 2018-05-04 NOTE — Telephone Encounter (Signed)
I was able to call the patient to discuss bronchoscopy.  We have rescheduled him for 3/20 However patient would like to hold off on the procedure for now as he is feeling well and cannot take time off work.  He would prefer to get a follow-up CT scan  Order CT chest without contrast in 3 months time Follow-up in clinic after CT chest.

## 2018-05-04 NOTE — Telephone Encounter (Signed)
Spoke with patient. He is aware of the CT order and need for OV. Order has been placed. Will place a recall for patient to follow up in 3 months.   Nothing further needed at time of call.

## 2018-05-05 ENCOUNTER — Encounter (HOSPITAL_COMMUNITY): Admission: RE | Payer: Self-pay | Source: Home / Self Care

## 2018-05-05 ENCOUNTER — Ambulatory Visit (HOSPITAL_COMMUNITY): Admission: RE | Admit: 2018-05-05 | Payer: 59 | Source: Home / Self Care | Admitting: Pulmonary Disease

## 2018-05-05 ENCOUNTER — Encounter (HOSPITAL_COMMUNITY): Payer: 59

## 2018-05-05 SURGERY — BRONCHOSCOPY, WITH FLUOROSCOPY
Anesthesia: Moderate Sedation | Laterality: Bilateral

## 2018-08-04 ENCOUNTER — Ambulatory Visit (INDEPENDENT_AMBULATORY_CARE_PROVIDER_SITE_OTHER): Payer: 59

## 2018-08-04 ENCOUNTER — Other Ambulatory Visit: Payer: Self-pay

## 2018-08-04 DIAGNOSIS — R911 Solitary pulmonary nodule: Secondary | ICD-10-CM

## 2018-08-10 ENCOUNTER — Other Ambulatory Visit: Payer: Self-pay | Admitting: Pulmonary Disease

## 2018-08-10 DIAGNOSIS — R911 Solitary pulmonary nodule: Secondary | ICD-10-CM

## 2018-08-10 NOTE — Progress Notes (Signed)
F/u right lung mass 6-8 mos.

## 2019-03-17 ENCOUNTER — Other Ambulatory Visit: Payer: 59

## 2019-12-05 IMAGING — CT CT CHEST W/ CM
2 of 3 series · 15 of 36 positions shown, 18 images · IV contrast (iopamidol)
Comparison: 03/08/2018 chest CT.  03/04/2018 chest radiograph.

CLINICAL DATA: Follow-up nodular consolidation.

EXAM:
CT CHEST WITH CONTRAST
TECHNIQUE: Multidetector CT imaging of the chest was performed during
intravenous contrast administration.
CONTRAST:  80mL 8NDUUR-9NN IOPAMIDOL (8NDUUR-9NN) INJECTION 61%

[Series 2: axial st · axial · 0.91mm/px · z∈[-394,-89]mm · 12 of 73 slices shown, 15 images]
[im 6/73  mediastinal]
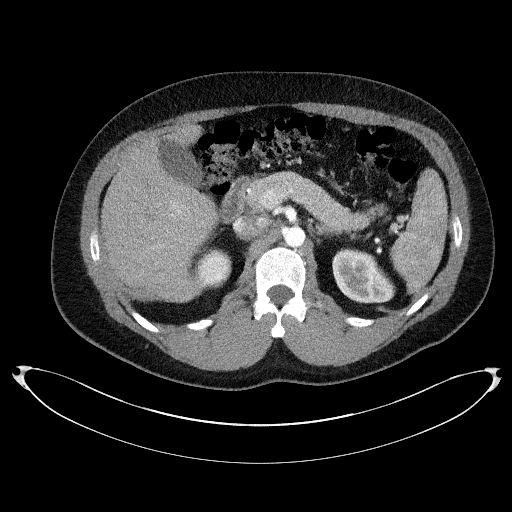
[im 6/73  lung]
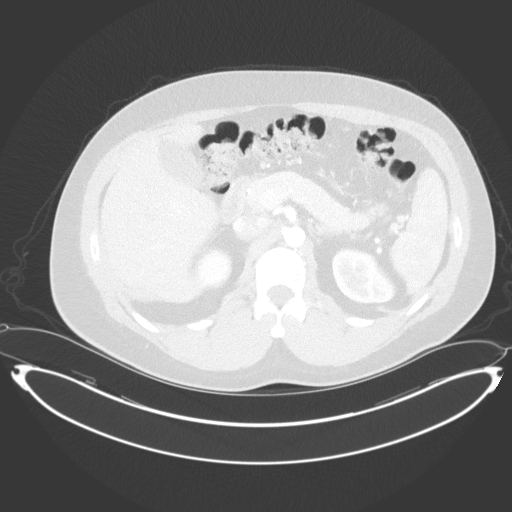
[im 11/73  lung]
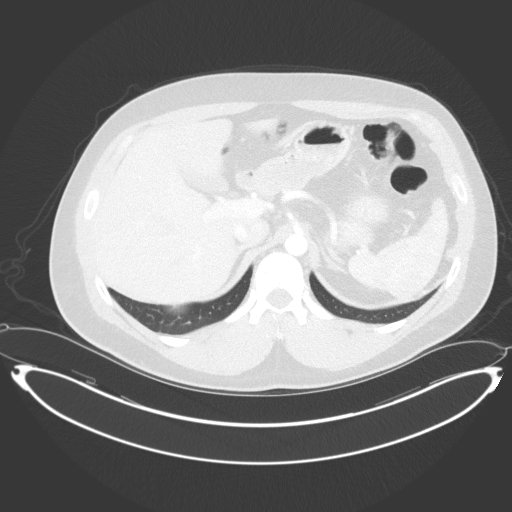
[im 17/73  lung]
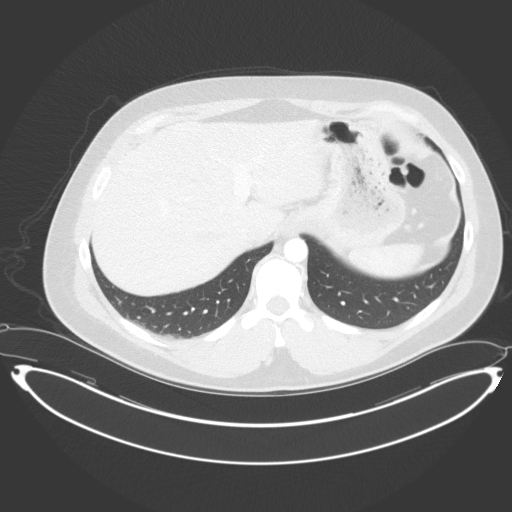
[im 22/73  lung]
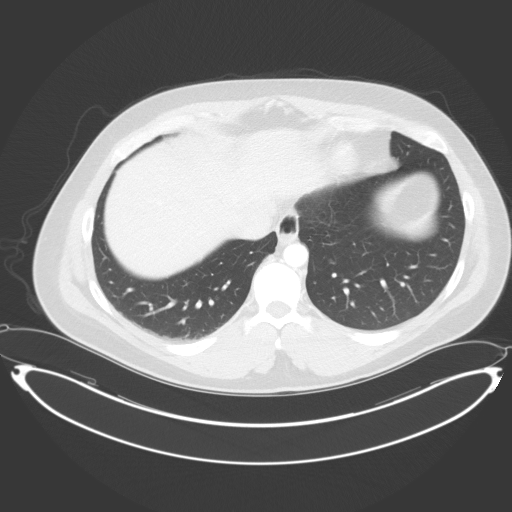
[im 27/73  mediastinal]
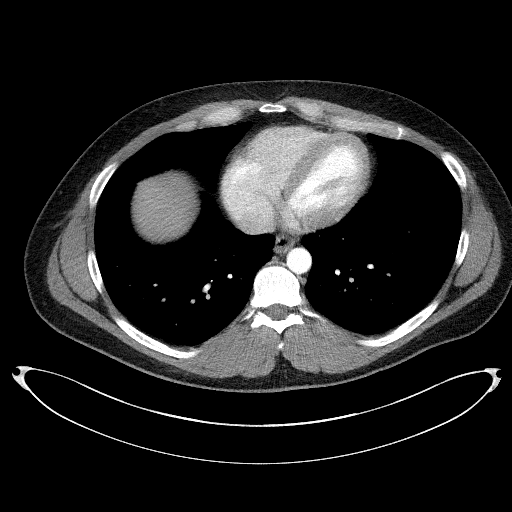
[im 27/73  lung]
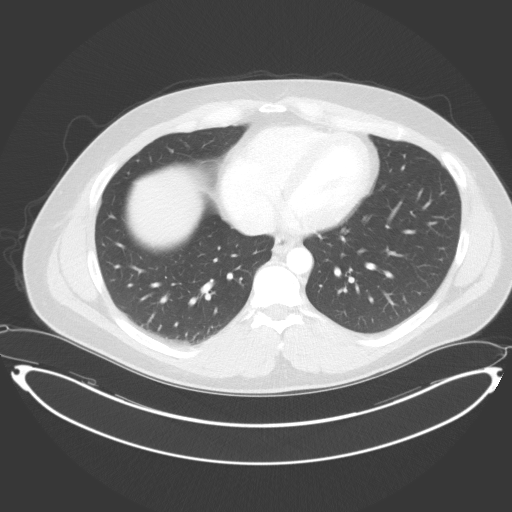
[im 33/73  lung]
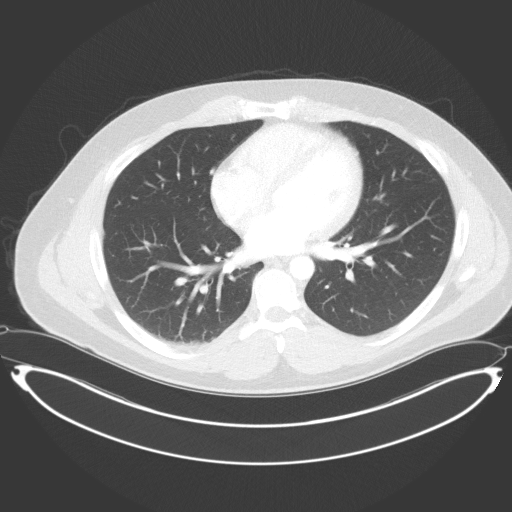
[im 41/73  lung]
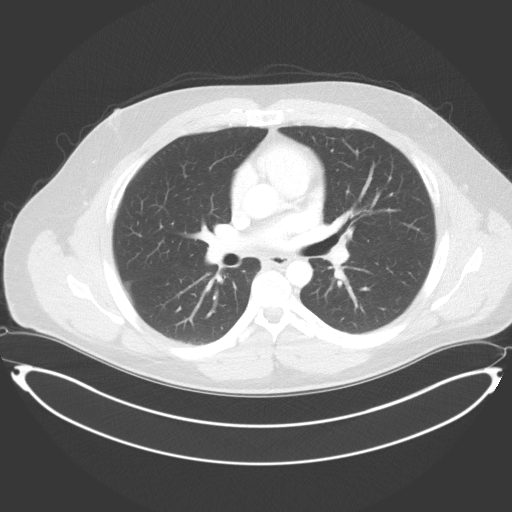
[im 46/73  lung]
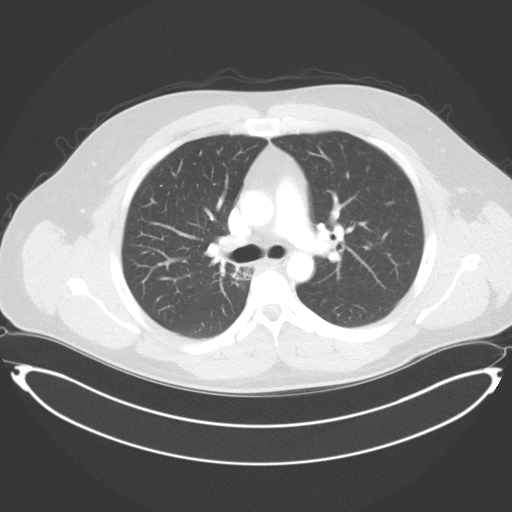
[im 51/73  mediastinal]
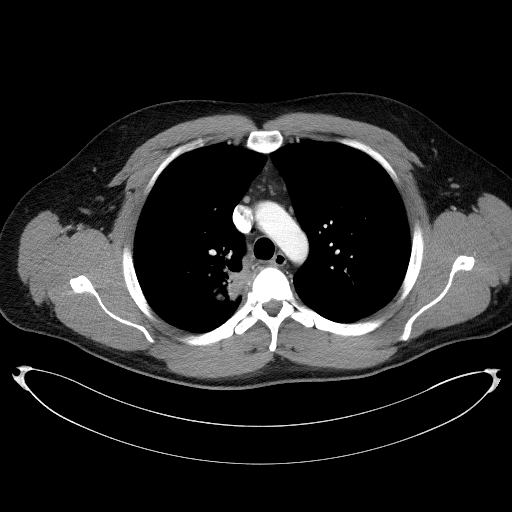
[im 51/73  lung]
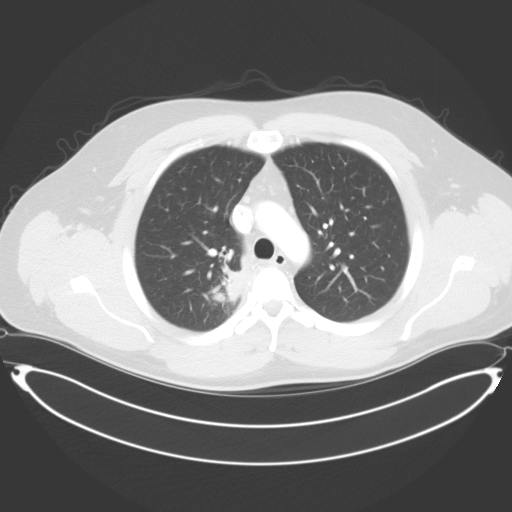
[im 57/73  lung]
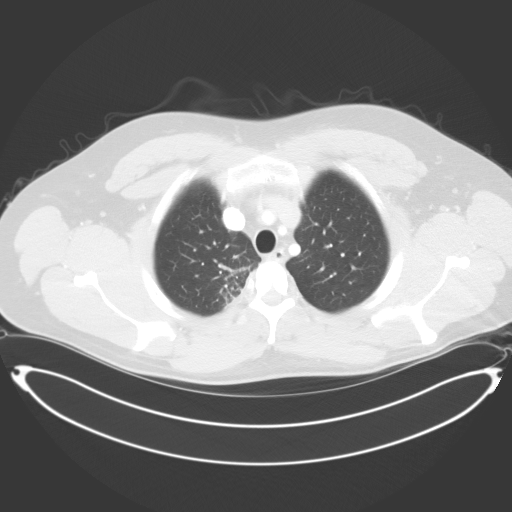
[im 62/73  lung]
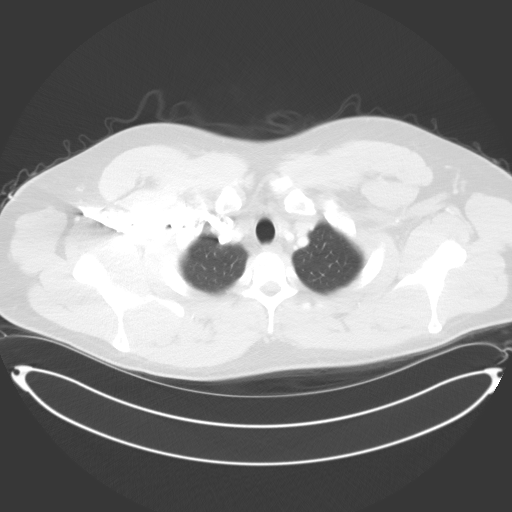
[im 67/73  lung]
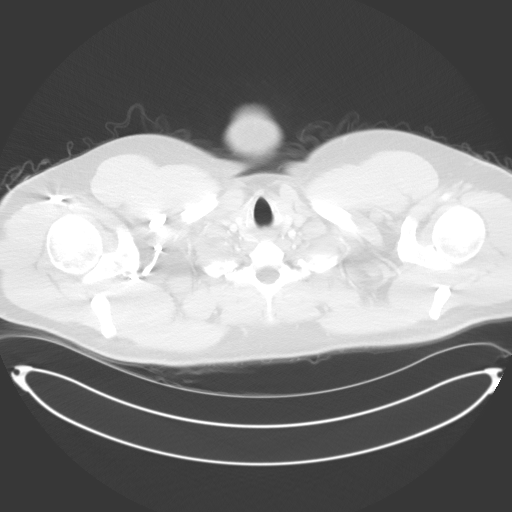

[Series 5: coronal · coronal · 0.72mm/px · 3 of 145 slices shown]
[im 29/145  lung]
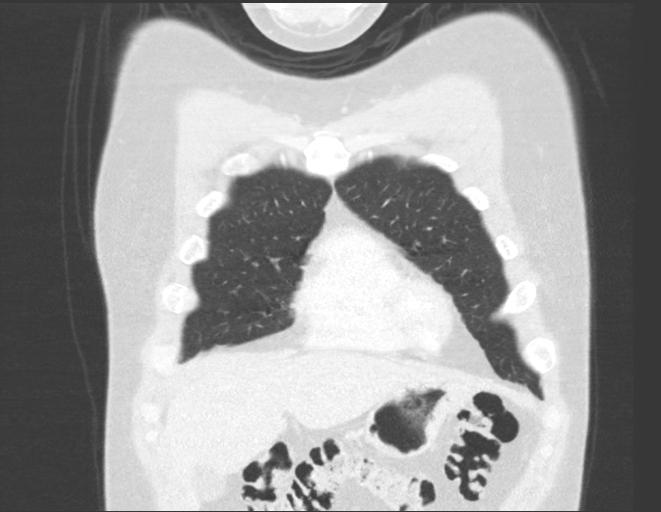
[im 58/145  lung]
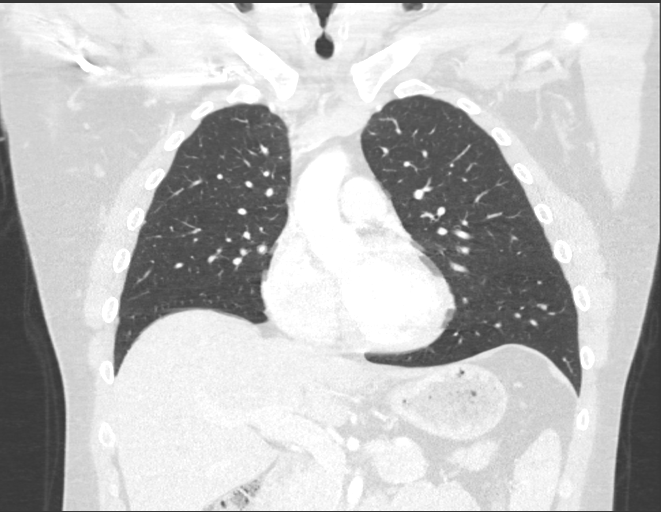
[im 87/145  lung]
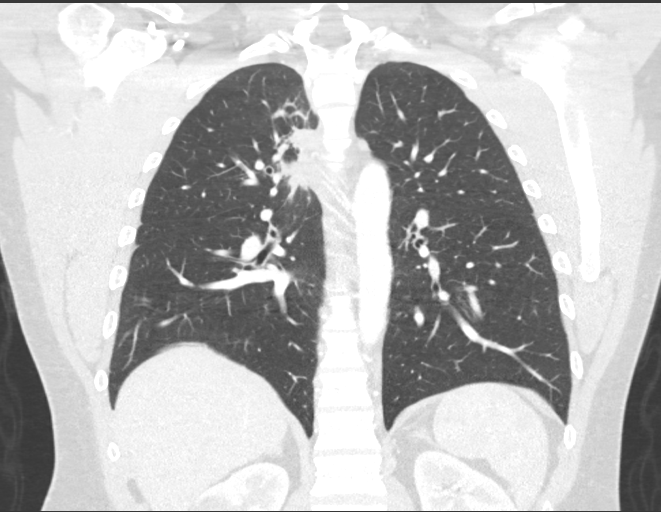

[15 of 36 positions shown; findings below may reference images not displayed]

FINDINGS: Cardiovascular: Normal heart size. No significant pericardial
effusion/thickening. Great vessels are normal in course and caliber.
No central pulmonary emboli.

Mediastinum/Nodes: No discrete thyroid nodules. Unremarkable
esophagus. No pathologically enlarged axillary, mediastinal or hilar
lymph nodes.

Lungs/Pleura: No pneumothorax. No pleural effusion. Irregular
pleural-based masslike focus of consolidation in the posteromedial
right upper lobe measures 4.2 x 1.9 cm (series 3/image 50),
previously 5.1 x 2.4 cm using similar measurement technique, mildly
decreased. There is in inferior nodular component crossing into the
medial superior segment right lower lobe (series 5/image 92,
unchanged. No interval consolidative airspace disease. No new
significant pulmonary nodules.

Upper abdomen: Partially visualized simple 1.7 cm upper right renal
cyst.

Musculoskeletal:  No aggressive appearing focal osseous lesions.
IMPRESSION: 1. Persistent irregular pleural-based masslike focus of
consolidation in the posteromedial right upper lobe crossing into
the medial superior segment right lower lobe, mildly decreased in
size since 03/08/2018 chest CT. This slowly resolving process that
crosses the pleural surface is suggestive of an atypical chronic
infection such as due to tuberculosis, actinomycosis, mucormycosis
or blastomycosis. A neoplastic process is less likely given mildly
decreased size. Continued close chest CT surveillance is suggested
in 3 months to document continued resolution.
2. No thoracic adenopathy.  No new sites of disease in the chest.
